# Patient Record
Sex: Female | Born: 1980 | Race: Black or African American | Hispanic: No | Marital: Married | Smoking: Never smoker
Health system: Southern US, Community
[De-identification: ages and names within clinical notes are randomized; demographics above are authoritative.]

---

## 2018-11-14 DIAGNOSIS — J0100 Acute maxillary sinusitis, unspecified: Secondary | ICD-10-CM | POA: Diagnosis not present

## 2019-05-02 DIAGNOSIS — Z01419 Encounter for gynecological examination (general) (routine) without abnormal findings: Secondary | ICD-10-CM | POA: Diagnosis not present

## 2019-05-02 DIAGNOSIS — Z118 Encounter for screening for other infectious and parasitic diseases: Secondary | ICD-10-CM | POA: Diagnosis not present

## 2019-05-02 DIAGNOSIS — Z3042 Encounter for surveillance of injectable contraceptive: Secondary | ICD-10-CM | POA: Diagnosis not present

## 2019-05-02 DIAGNOSIS — Z6831 Body mass index (BMI) 31.0-31.9, adult: Secondary | ICD-10-CM | POA: Diagnosis not present

## 2019-05-30 DIAGNOSIS — E782 Mixed hyperlipidemia: Secondary | ICD-10-CM | POA: Diagnosis not present

## 2019-05-30 DIAGNOSIS — R21 Rash and other nonspecific skin eruption: Secondary | ICD-10-CM | POA: Diagnosis not present

## 2019-05-30 DIAGNOSIS — J302 Other seasonal allergic rhinitis: Secondary | ICD-10-CM | POA: Diagnosis not present

## 2019-05-30 DIAGNOSIS — Z91048 Other nonmedicinal substance allergy status: Secondary | ICD-10-CM | POA: Diagnosis not present

## 2019-05-31 DIAGNOSIS — Z1389 Encounter for screening for other disorder: Secondary | ICD-10-CM | POA: Diagnosis not present

## 2019-05-31 DIAGNOSIS — D649 Anemia, unspecified: Secondary | ICD-10-CM | POA: Diagnosis not present

## 2019-05-31 DIAGNOSIS — E663 Overweight: Secondary | ICD-10-CM | POA: Diagnosis not present

## 2019-05-31 DIAGNOSIS — Z13220 Encounter for screening for lipoid disorders: Secondary | ICD-10-CM | POA: Diagnosis not present

## 2019-05-31 DIAGNOSIS — E782 Mixed hyperlipidemia: Secondary | ICD-10-CM | POA: Diagnosis not present

## 2019-05-31 DIAGNOSIS — Z1329 Encounter for screening for other suspected endocrine disorder: Secondary | ICD-10-CM | POA: Diagnosis not present

## 2019-06-22 ENCOUNTER — Other Ambulatory Visit: Payer: Self-pay

## 2019-06-22 ENCOUNTER — Ambulatory Visit (HOSPITAL_COMMUNITY)
Admission: EM | Admit: 2019-06-22 | Discharge: 2019-06-22 | Disposition: A | Discharge status: Home / Self Care | Payer: BC Managed Care – PPO

## 2019-06-22 ENCOUNTER — Encounter (HOSPITAL_COMMUNITY): Payer: Self-pay

## 2019-06-22 DIAGNOSIS — S0502XA Injury of conjunctiva and corneal abrasion without foreign body, left eye, initial encounter: Secondary | ICD-10-CM | POA: Diagnosis not present

## 2019-06-22 MED ORDER — TETRACAINE HCL 0.5 % OP SOLN
OPHTHALMIC | Status: AC
  Filled 2019-06-22: qty 4

## 2019-06-22 MED ORDER — ERYTHROMYCIN 5 MG/GM OP OINT: TOPICAL_OINTMENT | OPHTHALMIC | 0 refills | Status: DC

## 2019-06-22 MED ORDER — FLUORESCEIN SODIUM 1 MG OP STRP
ORAL_STRIP | OPHTHALMIC | Status: AC
  Filled 2019-06-22: qty 1

## 2019-06-22 NOTE — ED Triage Notes (Signed)
Pt states her left eye started being irritated yesterday and was having some watery eyes. Did take her contact out and the pain got worse and was unable to get it back in. Flushed her eye but then this morning having swelling, pain, redness, discharge and sensitivity to light. Felt as if something is in her eye.

## 2019-06-22 NOTE — ED Provider Notes (Signed)
[facility]    CSN: [id number] Arrival date & time: 06/22/19  1027     History   Chief Complaint Chief Complaint  Patient presents with  . Eye Problem    HPI Jessica Mejia is a 38 y.o. female.   Patient is a 38 year old female who presents today with left eye discomfort, irritation, redness and watering.  This began yesterday.  Patient does wear contacts.  She took her contacts out yesterday and flushed the eye out without much relief.  This morning started having increased swelling, pain, redness and discharge.  Some photophobia.  Has sensation of something in the eye.  Denies any known foreign bodies in the eye.  Denies any injuries to the eye.   ROS per HPI      History reviewed. No pertinent past medical history.  There are no active problems to display for this patient.   History reviewed. No pertinent surgical history.  OB History   No obstetric history on file.      Home Medications    Prior to Admission medications   Medication Sig Start Date End Date Taking? Authorizing Provider  medroxyPROGESTERone (DEPO-PROVERA) 150 MG/ML injection INJECT 1ML EVERY 11 12 WEEKS 05/01/19  Yes [provider]  phentermine (ADIPEX-P) 37.5 MG tablet Take 37.5 mg by mouth daily. 05/19/19  Yes [provider]  erythromycin ophthalmic ointment Place a 1/2 inch ribbon of ointment into the lower eyelid 4 x day 06/22/19   Orvan July, NP    Family History Family History  Problem Relation Age of Onset  . Diabetes Mother   . Hypertension Mother   . Heart disease Father     Social History Social History   Tobacco Use  . Smoking status: Never Smoker  . Smokeless tobacco: Never Used  Substance Use Topics  . Alcohol use: Yes  . Drug use: Never     Allergies   Diphenhydramine hcl (sleep) and Nickel   Review of Systems Review of Systems   Physical Exam Triage Vital Signs ED Triage Vitals  Enc Vitals Group     BP 06/22/19 0928 (!)  120/95     Pulse Rate 06/22/19 0928 93     Resp 06/22/19 0928 18     Temp 06/22/19 0928 98.2 F (36.8 C)     Temp Source 06/22/19 0928 Oral     SpO2 06/22/19 0928 99 %     Weight 06/22/19 0930 178 lb (80.7 kg)     Height 06/22/19 0930 5\' 4"  (1.626 m)     Head Circumference --      Peak Flow --      Pain Score 06/22/19 0929 6     Pain Loc --      Pain Edu? --      Excl. in [place]? --    No data found.  Updated Vital Signs BP (!) 120/95 (BP Location: Left Arm)   Pulse 93   Temp 98.2 F (36.8 C) (Oral)   Resp 18   Ht 5\' 4"  (1.626 m)   Wt 178 lb (80.7 kg)   SpO2 99%   BMI 30.55 kg/m   Visual Acuity Right Eye Distance: 20/15 (corrected) Left Eye Distance: Unable to see any letters without her contacts Bilateral Distance:    Right Eye Near:   Left Eye Near:    Bilateral Near:     Physical Exam Vitals signs and nursing note reviewed.  Constitutional:      General: She is  not in acute distress.    Appearance: Normal appearance. She is not ill-appearing, toxic-appearing or diaphoretic.  HENT:     Head: Normocephalic and atraumatic.     Nose: Nose normal.     Mouth/Throat:     Pharynx: Oropharynx is clear.  Eyes:     General: Lids are normal.        Left eye: Discharge present.    Extraocular Movements: Extraocular movements intact.     Conjunctiva/sclera:     Right eye: Right conjunctiva is not injected. No chemosis, exudate or hemorrhage.    Left eye: Left conjunctiva is injected. No chemosis, exudate or hemorrhage.    Pupils: Pupils are equal, round, and reactive to light.      Comments: Corneal abrasion noted to left cornea at 6 o'clock.   Neurological:     Mental Status: She is alert.      UC Treatments / Results  Labs (all labs ordered are listed, but only abnormal results are displayed) Labs Reviewed - No data to display  EKG   Radiology No results found.  Procedures Procedures (including critical care time)  Medications Ordered in UC  Medications  tetracaine (PONTOCAINE) 0.5 % ophthalmic solution (has no administration in time range)  fluorescein 1 MG ophthalmic strip (has no administration in time range)    Initial Impression / Assessment and Plan / UC Course  I have reviewed the triage vital signs and the nursing notes.  Pertinent labs & imaging results that were available during my care of the patient were reviewed by me and considered in my medical decision making (see chart for details).     Corneal abrasion- treating with erythromycin ointment, 4 to 6 times a day. Follow up with ophthalmology as needed.   Final Clinical Impressions(s) / UC Diagnoses   Final diagnoses:  Abrasion of left cornea, initial encounter     Discharge Instructions     Small abrasion to left cornea most likely due to contacts or foreign body  Treating this with erythromycin ointment. Use this 4-6 times a day.  If the problem persist she will need to follow-up with your ophthalmologist.    ED Prescriptions    Medication Sig Dispense Auth. Provider   erythromycin ophthalmic ointment  (Status: Discontinued) Place a 1/2 inch ribbon of ointment into the lower eyelid 4 x day 3.5 g Donise Woodle A, NP   erythromycin ophthalmic ointment  (Status: Discontinued) Place a 1/2 inch ribbon of ointment into the lower eyelid 4 x day 3.5 g Stevana Dufner A, NP   erythromycin ophthalmic ointment  (Status: Discontinued) Place a 1/2 inch ribbon of ointment into the lower eyelid 4 x day 3.5 g Rider Ermis A, NP   erythromycin ophthalmic ointment Place a 1/2 inch ribbon of ointment into the lower eyelid 4 x day 3.5 g Jaci LazierBast, Natausha Jungwirth A, NP     Controlled Substance Prescriptions [place] Controlled Substance Registry consulted? Not Applicable   Janace ArisBast, Briant Angelillo A, NP 06/22/19 1045

## 2019-06-22 NOTE — Discharge Instructions (Addendum)
Small abrasion to left cornea most likely due to contacts or foreign body  Treating this with erythromycin ointment. Use this 4-6 times a day.  If the problem persist she will need to follow-up with your ophthalmologist.

## 2019-07-25 DIAGNOSIS — Z3042 Encounter for surveillance of injectable contraceptive: Secondary | ICD-10-CM | POA: Diagnosis not present

## 2019-09-16 ENCOUNTER — Other Ambulatory Visit: Payer: Self-pay

## 2019-09-16 DIAGNOSIS — Z20822 Contact with and (suspected) exposure to covid-19: Secondary | ICD-10-CM

## 2019-09-17 LAB — NOVEL CORONAVIRUS, NAA: SARS-CoV-2, NAA: NOT DETECTED

## 2021-02-12 ENCOUNTER — Ambulatory Visit (HOSPITAL_COMMUNITY)
Admission: RE | Admit: 2021-02-12 | Discharge: 2021-02-12 | Disposition: A | Discharge status: Home / Self Care | Payer: BC Managed Care – PPO | Source: Ambulatory Visit

## 2021-02-12 ENCOUNTER — Encounter (HOSPITAL_COMMUNITY): Payer: Self-pay

## 2021-02-12 ENCOUNTER — Ambulatory Visit (INDEPENDENT_AMBULATORY_CARE_PROVIDER_SITE_OTHER): Payer: BC Managed Care – PPO

## 2021-02-12 ENCOUNTER — Other Ambulatory Visit: Payer: Self-pay

## 2021-02-12 VITALS — BP 135/89 | HR 100 | Temp 99.0°F | Resp 20

## 2021-02-12 DIAGNOSIS — M25572 Pain in left ankle and joints of left foot: Secondary | ICD-10-CM | POA: Diagnosis not present

## 2021-02-12 DIAGNOSIS — W19XXXA Unspecified fall, initial encounter: Secondary | ICD-10-CM | POA: Diagnosis not present

## 2021-02-12 DIAGNOSIS — S9001XA Contusion of right ankle, initial encounter: Secondary | ICD-10-CM | POA: Diagnosis not present

## 2021-02-12 DIAGNOSIS — M25472 Effusion, left ankle: Secondary | ICD-10-CM | POA: Diagnosis not present

## 2021-02-12 MED ORDER — IBUPROFEN 800 MG PO TABS: 800.0000 mg | ORAL_TABLET | Freq: Three times a day (TID) | ORAL | 0 refills | Status: DC

## 2021-02-12 MED ORDER — CYCLOBENZAPRINE HCL 10 MG PO TABS: 10.0000 mg | ORAL_TABLET | Freq: Every day | ORAL | 0 refills | Status: DC

## 2021-02-12 NOTE — Discharge Instructions (Addendum)
Take ibuprofen 800 mg with food three times a day for 3 days then as needed  Can use brace or ace wrap for additional support while moving around  Can use heat or ice per preference for additional comfort  Follow up for increased swelling , increased pain, decreased movement of ankle

## 2021-02-12 NOTE — ED Triage Notes (Signed)
Patient fell in [place] on Friday.  Patient was walking quickly, looking forward.  Stepped on uneven curbing, rolled left ankle and landed on right knee.  Left ankle is swollen and painful.  Patient states lateral ankle is painful to palpation, to weight bearing medial is painful.  Visible swelling and bruising present.

## 2021-02-12 NOTE — ED Provider Notes (Signed)
[facility]    CSN: [id number] Arrival date & time: 02/12/21  1352      History   Chief Complaint Chief Complaint  Patient presents with  . Appointment    14:00  . Ankle Pain    HPI Jessica Mejia is a 40 y.o. female.   Patient presents with left ankle pain and swelling from rolling ankle both directions after stumbling off curb. Able to bear weight but painful. ROM intact but painful. Bruising along medial foot but resolved. Denies numbness, tingling.   History reviewed. No pertinent past medical history.  There are no problems to display for this patient.   History reviewed. No pertinent surgical history.  OB History   No obstetric history on file.      Home Medications    Prior to Admission medications   Medication Sig Start Date End Date Taking? Authorizing Provider  folic acid (FOLVITE) 1 MG tablet Take 1 mg by mouth daily.   Yes [provider]  NON FORMULARY Allergy medicine, otc   Yes [provider]  erythromycin ophthalmic ointment Place a 1/2 inch ribbon of ointment into the lower eyelid 4 x day 06/22/19   Dahlia Byes A, NP  medroxyPROGESTERone (DEPO-PROVERA) 150 MG/ML injection INJECT EVERY 11 12 WEEKS 05/01/19   [provider]  phentermine (ADIPEX-P) 37.5 MG tablet Take 37.5 mg by mouth daily. 05/19/19   [provider]    Family History Family History  Problem Relation Age of Onset  . Diabetes Mother   . Hypertension Mother   . Heart disease Father     Social History Social History   Tobacco Use  . Smoking status: Never Smoker  . Smokeless tobacco: Never Used  Vaping Use  . Vaping Use: Never used  Substance Use Topics  . Alcohol use: Yes  . Drug use: Never     Allergies   Diphenhydramine hcl (sleep) and Nickel   Review of Systems Review of Systems  Constitutional: Negative.   Respiratory: Negative.   Cardiovascular: Negative.   Musculoskeletal: Negative.   Neurological:  Negative.      Physical Exam Triage Vital Signs ED Triage Vitals  Enc Vitals Group     BP 02/12/21 1429 135/89     Pulse Rate 02/12/21 1429 100     Resp 02/12/21 1429 20     Temp 02/12/21 1429 99 F (37.2 C)     Temp Source 02/12/21 1429 Oral     SpO2 02/12/21 1429 97 %     Weight --      Height --      Head Circumference --      Peak Flow --      Pain Score 02/12/21 1424 6     Pain Loc --      Pain Edu? --      Excl. in [place]? --    No data found.  Updated Vital Signs BP 135/89 (BP Location: Right Arm)   Pulse 100   Temp 99 F (37.2 C) (Oral)   Resp 20   SpO2 97%   Visual Acuity Right Eye Distance:   Left Eye Distance:   Bilateral Distance:    Right Eye Near:   Left Eye Near:    Bilateral Near:     Physical Exam Constitutional:      Appearance: Normal appearance. She is normal weight.  HENT:     Head: Normocephalic.  Eyes:     Extraocular Movements: Extraocular movements  intact.  Pulmonary:     Effort: Pulmonary effort is normal.  Musculoskeletal:     Cervical back: Normal range of motion.     Right ankle: Normal.     Left ankle: Swelling present. No ecchymosis. Tenderness present over the CF ligament. Normal range of motion.     Left Achilles Tendon: Normal.  Skin:    General: Skin is warm and dry.  Neurological:     General: No focal deficit present.     Mental Status: She is alert and oriented to person, place, and time. Mental status is at baseline.  Psychiatric:        Mood and Affect: Mood normal.        Behavior: Behavior normal.        Thought Content: Thought content normal.        Judgment: Judgment normal.      UC Treatments / Results  Labs (all labs ordered are listed, but only abnormal results are displayed) Labs Reviewed - No data to display  EKG   Radiology No results found.  Procedures Procedures (including critical care time)  Medications Ordered in UC Medications - No data to display  Initial Impression /  Assessment and Plan / UC Course  I have reviewed the triage vital signs and the nursing notes.  Pertinent labs & imaging results that were available during my care of the patient were reviewed by me and considered in my medical decision making (see chart for details).  Acute left ankle pain   1. Xray left ankle- soft tissue swelling 2. Ibuprofen 800 mg tid 3. Flexeril 10 mg bedtime prn 4. Heat or ice 15 minute intervals 5. May continue use of ankle brace for support  Final Clinical Impressions(s) / UC Diagnoses   Final diagnoses:  None   Discharge Instructions   None    ED Prescriptions    None     PDMP not reviewed this encounter.   Valinda Hoar, [place] 02/12/21 [phone]

## 2022-08-17 ENCOUNTER — Telehealth: Payer: BC Managed Care – PPO | Admitting: Family Medicine

## 2022-08-17 DIAGNOSIS — B9789 Other viral agents as the cause of diseases classified elsewhere: Secondary | ICD-10-CM

## 2022-08-17 DIAGNOSIS — J019 Acute sinusitis, unspecified: Secondary | ICD-10-CM | POA: Diagnosis not present

## 2022-08-17 MED ORDER — FLUTICASONE PROPIONATE 50 MCG/ACT NA SUSP: 2.0000 | Freq: Every day | NASAL | 0 refills | Status: DC

## 2022-08-17 NOTE — Progress Notes (Signed)
Virtual Visit Consent   Jessica Mejia, you are scheduled for a virtual visit with a [facility] provider today. Just as with appointments in the office, your consent must be obtained to participate. Your consent will be active for this visit and any virtual visit you may have with one of our providers in the next 365 days. If you have a MyChart account, a copy of this consent can be sent to you electronically.  As this is a virtual visit, video technology does not allow for your provider to perform a traditional examination. This may limit your provider's ability to fully assess your condition. If your provider identifies any concerns that need to be evaluated in person or the need to arrange testing (such as labs, EKG, etc.), we will make arrangements to do so. Although advances in technology are sophisticated, we cannot ensure that it will always work on either your end or our end. If the connection with a video visit is poor, the visit may have to be switched to a telephone visit. With either a video or telephone visit, we are not always able to ensure that we have a secure connection.  By engaging in this virtual visit, you consent to the provision of healthcare and authorize for your insurance to be billed (if applicable) for the services provided during this visit. Depending on your insurance coverage, you may receive a charge related to this service.  I need to obtain your verbal consent now. Are you willing to proceed with your visit today? Jessica Mejia has provided verbal consent on 08/17/2022 for a virtual visit (video or telephone). Perlie Mayo, NP  Date: 08/17/2022 10:32 AM  Virtual Visit via Video Note   I, Perlie Mayo, connected with  Jessica Mejia  ([id number], 05-Aug-1981) on 08/17/22 at 10:30 AM EDT by a video-enabled telemedicine application and verified that I am speaking with the correct person using two identifiers.  Location: Patient: Virtual Visit  Location Patient: Home Provider: Virtual Visit Location Provider: Home Office   I discussed the limitations of evaluation and management by telemedicine and the availability of in person appointments. The patient expressed understanding and agreed to proceed.    History of Present Illness: Jessica Mejia is a 41 y.o. who identifies as a female who was assigned female at birth, and is being seen today for sore throat and congestion. Started Saturday with sore throat and tickle, cold and flu OTC started, then progressed to have congestion, headache (which has improved), dry cough mostly. Denies ear pain, chest pain, shortness of breath.Known sick contact in last week with family member. COVID negative on HT  Problems: There are no problems to display for this patient.   Allergies:  Allergies  Allergen Reactions   Diphenhydramine Hcl (Sleep) Swelling   Nickel Rash   Medications:  Current Outpatient Medications:    cyclobenzaprine (FLEXERIL) 10 MG tablet, Take 1 tablet (10 mg total) by mouth at bedtime., Disp: 10 tablet, Rfl: 0   folic acid (FOLVITE) 1 MG tablet, Take 1 mg by mouth daily., Disp: , Rfl:    ibuprofen (ADVIL) 800 MG tablet, Take 1 tablet (800 mg total) by mouth 3 (three) times daily., Disp: 21 tablet, Rfl: 0   NON FORMULARY, Allergy medicine, otc, Disp: , Rfl:   Observations/Objective: Patient is well-developed, well-nourished in no acute distress.  Resting comfortably  at home.  Head is normocephalic, atraumatic.  No labored breathing.  Speech is clear and coherent with logical  content.  Patient is alert and oriented at baseline.    Assessment and Plan: 1. Acute viral sinusitis  - fluticasone (FLONASE) 50 MCG/ACT nasal spray; Place 2 sprays into both nostrils daily.  Dispense: 16 g; Refill: 0  -rest -hydrate -OTC reviewed, pt TTC -Flonase for now, advised to stop once + HPT and call OB   Reviewed side effects, risks and benefits of medication.    Patient  acknowledged agreement and understanding of the plan.   Past Medical, Surgical, Social History, Allergies, and Medications have been Reviewed.    Follow Up Instructions: I discussed the assessment and treatment plan with the patient. The patient was provided an opportunity to ask questions and all were answered. The patient agreed with the plan and demonstrated an understanding of the instructions.  A copy of instructions were sent to the patient via MyChart unless otherwise noted below.     The patient was advised to call back or seek an in-person evaluation if the symptoms worsen or if the condition fails to improve as anticipated.  Time:  I spent 10 minutes with the patient via telehealth technology discussing the above problems/concerns.    Freddy Finner, NP

## 2022-08-17 NOTE — Patient Instructions (Addendum)
  [name], thank you for joining Perlie Mayo, NP for today's virtual visit.  While this provider is not your primary care provider (PCP), if your PCP is located in our provider database this encounter information will be shared with them immediately following your visit.  Consent: (Patient) Jessica Mejia provided verbal consent for this virtual visit at the beginning of the encounter.  Current Medications:  Current Outpatient Medications:    fluticasone (FLONASE) 50 MCG/ACT nasal spray, Place 2 sprays into both nostrils daily., Disp: 16 g, Rfl: 0   cyclobenzaprine (FLEXERIL) 10 MG tablet, Take 1 tablet (10 mg total) by mouth at bedtime., Disp: 10 tablet, Rfl: 0   folic acid (FOLVITE) 1 MG tablet, Take 1 mg by mouth daily., Disp: , Rfl:    ibuprofen (ADVIL) 800 MG tablet, Take 1 tablet (800 mg total) by mouth 3 (three) times daily., Disp: 21 tablet, Rfl: 0   NON FORMULARY, Allergy medicine, otc, Disp: , Rfl:    Medications ordered in this encounter:  Meds ordered this encounter  Medications   fluticasone (FLONASE) 50 MCG/ACT nasal spray    Sig: Place 2 sprays into both nostrils daily.    Dispense:  16 g    Refill:  0    Order Specific Question:   Supervising Provider    Answer:   Chase Picket [id number]     *If you need refills on other medications prior to your next appointment, please contact your pharmacy*  Follow-Up: Call back or seek an in-person evaluation if the symptoms worsen or if the condition fails to improve as anticipated.  [place] [phone]  Other Instructions  Please use Delsym for cough Flonase safe- but stop if your pregnancy test turns up positive  Hydrate and rest   If you have been instructed to have an in-person evaluation today at a local Urgent Care facility, please use the link below. It will take you to a list of all of our available [facility], including address, phone number and hours of  operation. Please do not delay care.  [facility]  If you or a family member do not have a primary care provider, use the link below to schedule a visit and establish care. When you choose a [facility] primary care physician or advanced practice provider, you gain a long-term partner in health. Find a Primary Care Provider  Learn more about [facility]'s in-office and virtual care options: [facility]

## 2022-09-16 IMAGING — DX DG ANKLE COMPLETE 3+V*L*
3 series · 3 of 3 positions shown · non-contrast
Comparison: None.

CLINICAL DATA: Status post fall. Rolled ankle. Pain and swelling
with bruising.

EXAM:
LEFT ANKLE COMPLETE - 3+ VIEW

[ankle ap]
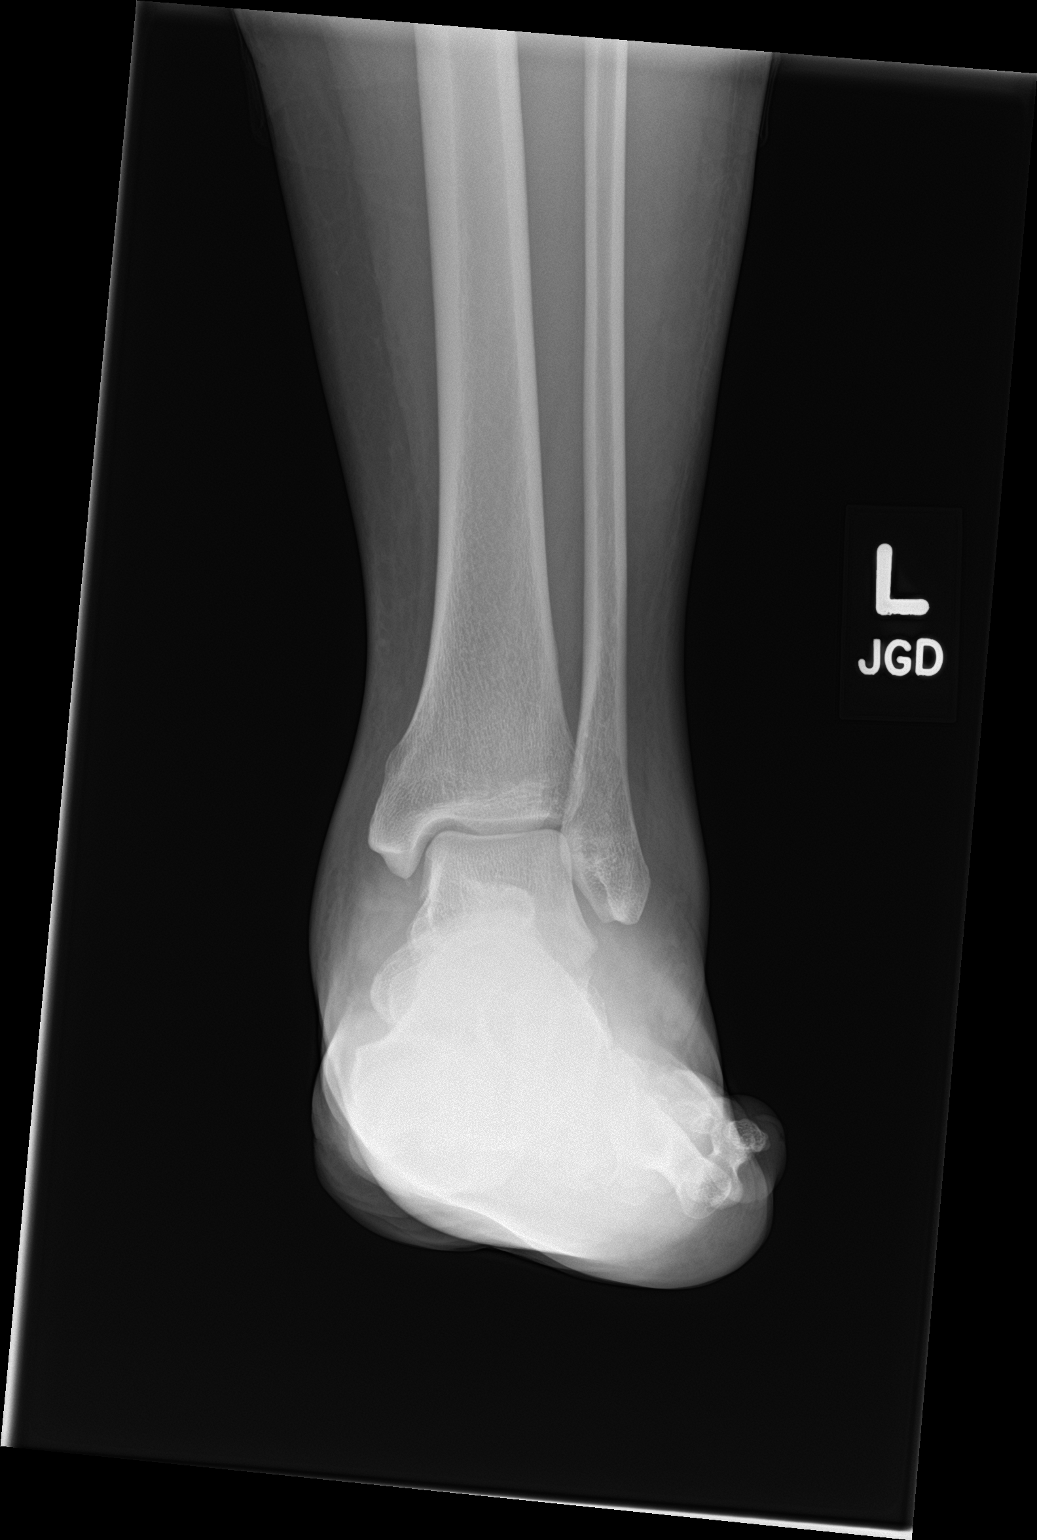

[ankle obl]
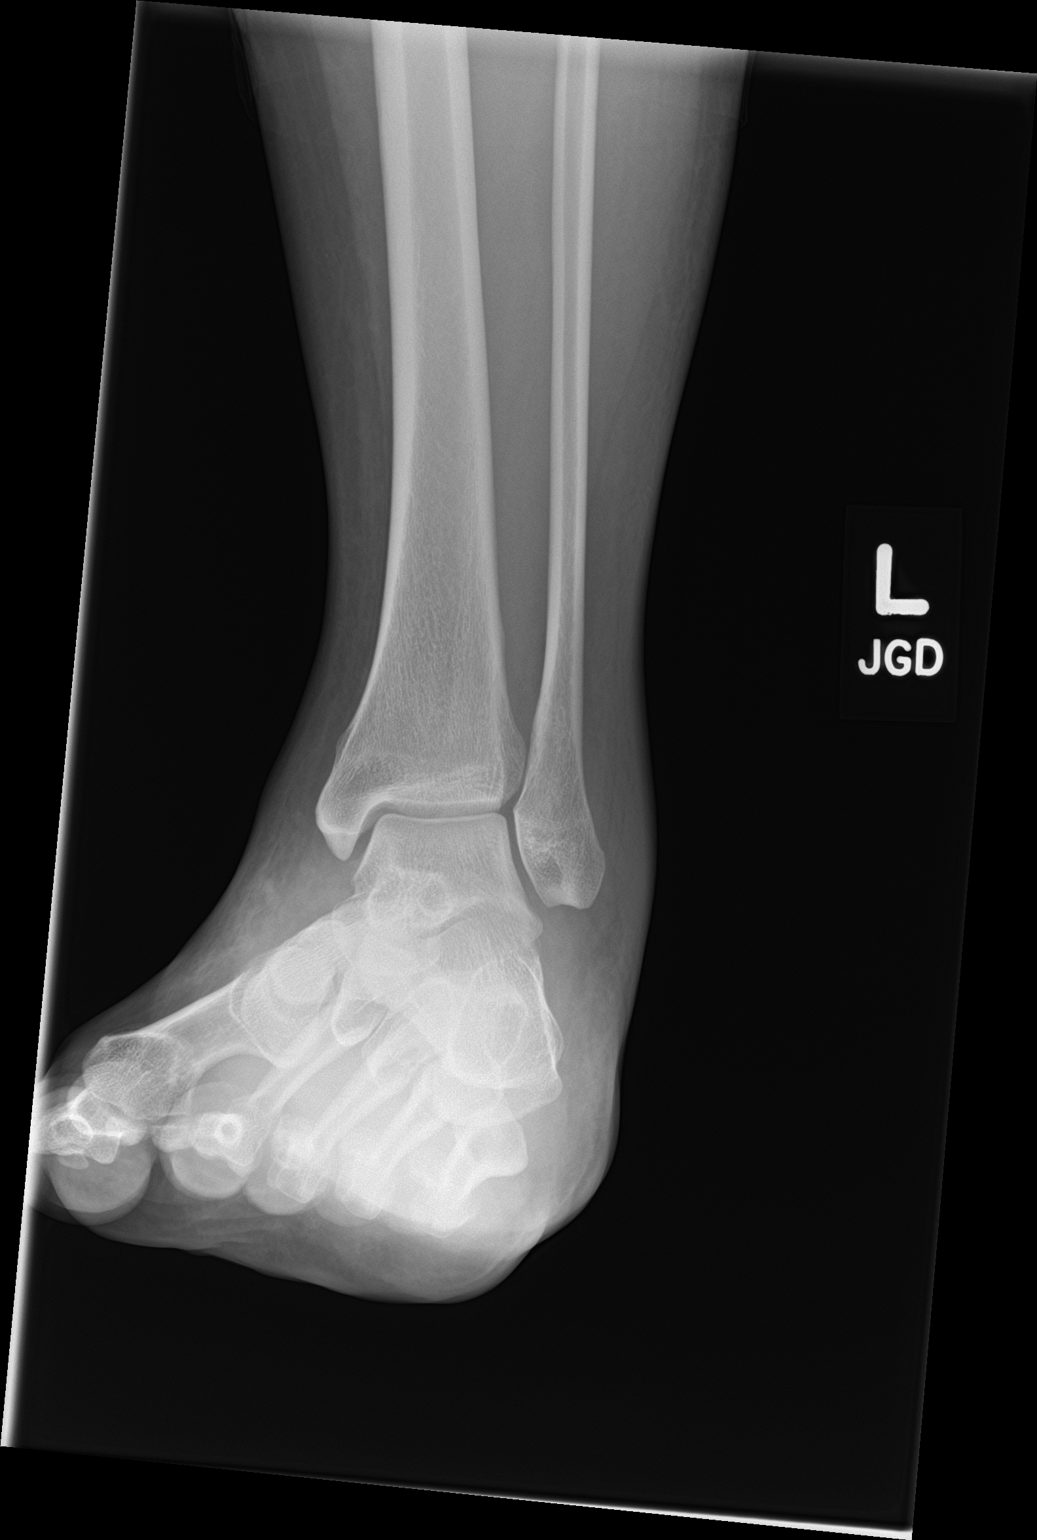

[ankle lat]
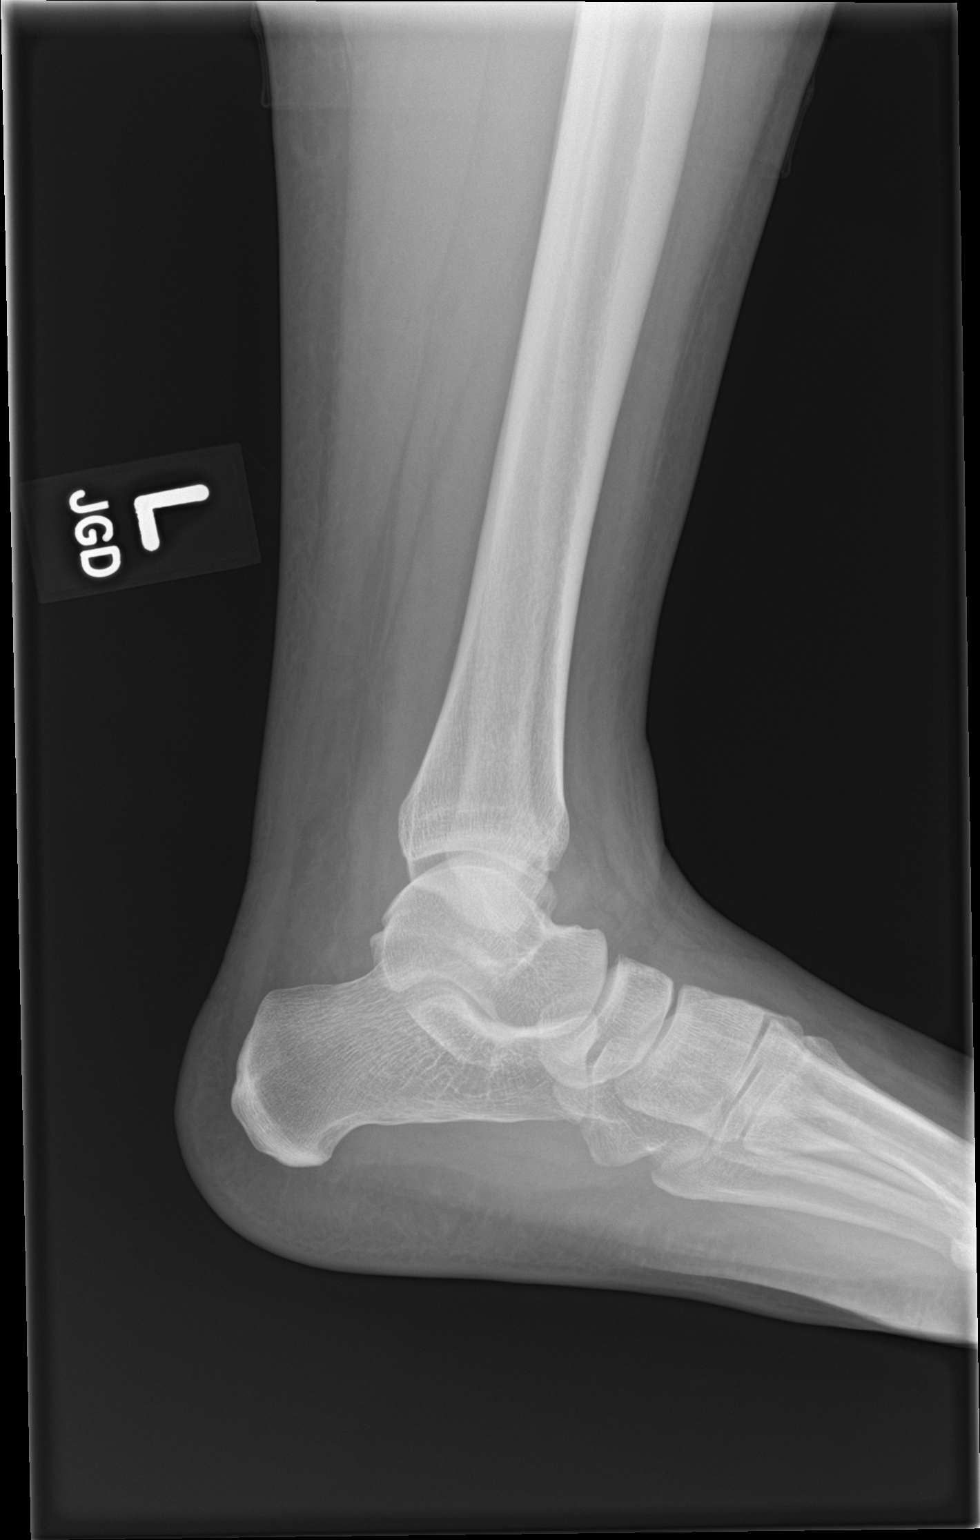

[3 of 3 positions shown; findings below may reference images not displayed]

FINDINGS: There is mild diffuse soft tissue swelling. No acute fracture or
dislocation identified. No radiopaque foreign bodies.
IMPRESSION: 1. Soft tissue swelling.
2. No acute bone abnormality.

## 2022-10-30 ENCOUNTER — Ambulatory Visit (HOSPITAL_COMMUNITY)
Admission: EM | Admit: 2022-10-30 | Discharge: 2022-10-30 | Discharge status: Against Medical Advice | Payer: BC Managed Care – PPO

## 2022-11-09 ENCOUNTER — Ambulatory Visit: Admit: 2022-11-09 | Discharge status: Against Medical Advice | Payer: BC Managed Care – PPO

## 2022-11-09 ENCOUNTER — Ambulatory Visit
Admission: EM | Admit: 2022-11-09 | Discharge: 2022-11-09 | Disposition: A | Discharge status: Home / Self Care | Payer: 59 | Attending: Family Medicine | Admitting: Family Medicine

## 2022-11-09 DIAGNOSIS — N3091 Cystitis, unspecified with hematuria: Secondary | ICD-10-CM | POA: Insufficient documentation

## 2022-11-09 DIAGNOSIS — N760 Acute vaginitis: Secondary | ICD-10-CM

## 2022-11-09 DIAGNOSIS — R3129 Other microscopic hematuria: Secondary | ICD-10-CM

## 2022-11-09 DIAGNOSIS — B3731 Acute candidiasis of vulva and vagina: Secondary | ICD-10-CM | POA: Insufficient documentation

## 2022-11-09 LAB — POCT URINALYSIS DIP (MANUAL ENTRY)
Bilirubin, UA: NEGATIVE
Glucose, UA: NEGATIVE mg/dL
Leukocytes, UA: NEGATIVE
Nitrite, UA: NEGATIVE
Protein Ur, POC: NEGATIVE mg/dL
Spec Grav, UA: >=1.03 — AB (ref 1.010–1.025)
Urobilinogen, UA: 0.2 E.U./dL
pH, UA: 5.5 (ref 5.0–8.0)

## 2022-11-09 NOTE — ED Triage Notes (Signed)
Pt c/o urinary frequency and dysuria since 12/23. At home test pos for UTI. Did goodrx televisit. Tx with Bactrim and also diflucan for yeast infection. Took the diflucan 12/28. Finished abx and sxs improved. Still thinks has yeast infection sxs. Neg UTI at home on Sunday.

## 2022-11-09 NOTE — ED Provider Notes (Signed)
[facility]    CSN: [id number] Arrival date & time: 11/09/22  1909      History   Chief Complaint Chief Complaint  Patient presents with   Dysuria    HPI Jessica Mejia is a 42 y.o. female.   HPI  Patient had a televisit for cystitis and was given Bactrim.  She completed the Bactrim and then developed a yeast infection.  She was called in [place].  She is here because she still has some vaginal irritation unidentified. She does not think she has an STD. No longer has dysuria  History reviewed. No pertinent past medical history.  There are no problems to display for this patient.   History reviewed. No pertinent surgical history.  OB History   No obstetric history on file.      Home Medications    Prior to Admission medications   Medication Sig Start Date End Date Taking? Authorizing Provider  cyclobenzaprine (FLEXERIL) 10 MG tablet Take 1 tablet (10 mg total) by mouth at bedtime. 02/12/21   White, Leitha Schuller, NP  fluticasone (FLONASE) 50 MCG/ACT nasal spray Place 2 sprays into both nostrils daily. 08/17/22   Perlie Mayo, NP  folic acid (FOLVITE) 1 MG tablet Take 1 mg by mouth daily.    [provider]  ibuprofen (ADVIL) 800 MG tablet Take 1 tablet (800 mg total) by mouth 3 (three) times daily. 02/12/21   White, Leitha Schuller, NP  NON FORMULARY Allergy medicine, otc    [provider]  medroxyPROGESTERone (DEPO-PROVERA) 150 MG/ML injection INJECT 1ML EVERY 11 12 WEEKS 05/01/19 02/12/21  [provider]  phentermine (ADIPEX-P) 37.5 MG tablet Take 37.5 mg by mouth daily. 05/19/19 02/12/21  [provider]    Family History Family History  Problem Relation Age of Onset   Diabetes Mother    Hypertension Mother    Heart disease Father     Social History Social History   Tobacco Use   Smoking status: Never   Smokeless tobacco: Never  Vaping Use   Vaping Use: Never used  Substance Use Topics   Alcohol use: Yes    Drug use: Never     Allergies   Diphenhydramine hcl (sleep) and Nickel   Review of Systems Review of Systems See HPI  Physical Exam Triage Vital Signs ED Triage Vitals  Enc Vitals Group     BP 11/09/22 1938 138/87     Pulse Rate 11/09/22 1938 89     Resp 11/09/22 1938 17     Temp 11/09/22 1938 98.3 F (36.8 C)     Temp Source 11/09/22 1938 Oral     SpO2 11/09/22 1938 98 %     Weight --      Height --      Head Circumference --      Peak Flow --      Pain Score 11/09/22 1939 0     Pain Loc --      Pain Edu? --      Excl. in [place]? --    No data found.  Updated Vital Signs BP 138/87 (BP Location: Right Arm)   Pulse 89   Temp 98.3 F (36.8 C) (Oral)   Resp 17   SpO2 98%   Physical Exam Constitutional:      General: She is not in acute distress.    Appearance: Normal appearance. She is well-developed. She is not ill-appearing.  HENT:     Head: Normocephalic and atraumatic.  Eyes:     Conjunctiva/sclera: Conjunctivae normal.     Pupils: Pupils are equal, round, and reactive to light.  Cardiovascular:     Rate and Rhythm: Normal rate.  Pulmonary:     Effort: Pulmonary effort is normal. No respiratory distress.  Abdominal:     General: There is no distension.     Palpations: Abdomen is soft.     Tenderness: There is no right CVA tenderness or left CVA tenderness.  Musculoskeletal:        General: Normal range of motion.     Cervical back: Normal range of motion.  Skin:    General: Skin is warm and dry.  Neurological:     Mental Status: She is alert.      UC Treatments / Results  Labs (all labs ordered are listed, but only abnormal results are displayed) Labs Reviewed  POCT URINALYSIS DIP (MANUAL ENTRY) - Abnormal; Notable for the following components:      Result Value   Ketones, POC UA small (15) (*)    Spec Grav, UA >=1.030 (*)    Blood, UA small (*)    All other components within normal limits  URINE CULTURE  CERVICOVAGINAL ANCILLARY ONLY     EKG   Radiology No results found.  Procedures Procedures (including critical care time)  Medications Ordered in UC Medications - No data to display  Initial Impression / Assessment and Plan / UC Course  I have reviewed the triage vital signs and the nursing notes.  Pertinent labs & imaging results that were available during my care of the patient were reviewed by me and considered in my medical decision making (see chart for details).     Because of microscopic hematuria I am sending the urine for culture.  Also going to do a vaginal swab to check on her complaint of vaginitis.  She will be called with any positive test results.  We will treat appropriately Final Clinical Impressions(s) / UC Diagnoses   Final diagnoses:  Acute vaginitis  Microscopic hematuria     Discharge Instructions      Continue to drink water Check MyChart for test results You will be called if any results are positive Call Wednesday after noon before you leave to check on your urine culture   ED Prescriptions   None    PDMP not reviewed this encounter.   Raylene Everts, MD 11/09/22 2032

## 2022-11-09 NOTE — Discharge Instructions (Addendum)
Continue to drink water Check MyChart for test results You will be called if any results are positive Call Wednesday after noon before you leave to check on your urine culture

## 2022-11-10 ENCOUNTER — Telehealth: Payer: Self-pay

## 2022-11-10 ENCOUNTER — Telehealth: Payer: Self-pay | Admitting: Family Medicine

## 2022-11-10 MED ORDER — SULFAMETHOXAZOLE-TRIMETHOPRIM 800-160 MG PO TABS: 1.0000 | ORAL_TABLET | Freq: Two times a day (BID) | ORAL | 0 refills | Status: AC

## 2022-11-10 NOTE — Telephone Encounter (Signed)
Pt calls [facility] to report that she is going out of the country tomorrow morning and her urine culture results are still in process. She was in for UTI s/s yesterday and UA showed a small amount of blood. Pt states she will likely be out of the country when results are finalized and she is concerned about not having an antibiotic to treat her if she has a UTI. Discussed w/ M. Ragan, FNP, who states he will submit Rx to [facility] on [address] in [place]. Notified pt and advised to check for urine culture results tomorrow if possible and begin antibiotic only if the urine culture indicates this is necessary. She verbalizes understanding.

## 2022-11-10 NOTE — Telephone Encounter (Signed)
Patient request antibiotic for possible UTI as she is going out of the country tomorrow and urine culture is not back.  Patient advised we will send Bactrim to her pharmacy now.

## 2022-11-11 LAB — CERVICOVAGINAL ANCILLARY ONLY
Bacterial Vaginitis (gardnerella): NEGATIVE
Candida Glabrata: NEGATIVE
Candida Vaginitis: NEGATIVE
Chlamydia: NEGATIVE
Comment: NEGATIVE
Comment: NEGATIVE
Comment: NEGATIVE
Comment: NEGATIVE
Comment: NEGATIVE
Comment: NORMAL
Neisseria Gonorrhea: NEGATIVE
Trichomonas: NEGATIVE

## 2022-11-11 LAB — URINE CULTURE: Culture: <10000 — AB

## 2024-10-13 ENCOUNTER — Ambulatory Visit
Admission: RE | Admit: 2024-10-13 | Discharge: 2024-10-13 | Disposition: A | Discharge status: Home / Self Care | Source: Ambulatory Visit | Attending: Nurse Practitioner

## 2024-10-13 VITALS — BP 126/86 | HR 83 | Temp 98.2°F | Resp 17

## 2024-10-13 DIAGNOSIS — M545 Low back pain, unspecified: Secondary | ICD-10-CM

## 2024-10-13 LAB — POCT URINE DIPSTICK
Bilirubin, UA: NEGATIVE
Glucose, UA: NEGATIVE mg/dL
Ketones, POC UA: NEGATIVE mg/dL
Leukocytes, UA: NEGATIVE
Nitrite, UA: NEGATIVE
POC PROTEIN,UA: 30 — AB
Spec Grav, UA: >=1.03 — AB (ref 1.010–1.025)
Urobilinogen, UA: 0.2 U/dL
pH, UA: 5.5 (ref 5.0–8.0)

## 2024-10-13 LAB — POCT URINE PREGNANCY: Preg Test, Ur: NEGATIVE

## 2024-10-13 MED ORDER — DEXAMETHASONE SOD PHOSPHATE PF 10 MG/ML IJ SOLN
10.0000 mg | Freq: Once | INTRAMUSCULAR | Status: AC
  Administered 2024-10-13: 10 mg via INTRAMUSCULAR

## 2024-10-13 MED ORDER — METHOCARBAMOL 500 MG PO TABS: 500.0000 mg | ORAL_TABLET | Freq: Every morning | ORAL | 0 refills | Status: DC

## 2024-10-13 MED ORDER — LIDOCAINE 5 % EX PTCH: 1.0000 | MEDICATED_PATCH | CUTANEOUS | 0 refills | Status: DC

## 2024-10-13 MED ORDER — CYCLOBENZAPRINE HCL 10 MG PO TABS: 10.0000 mg | ORAL_TABLET | Freq: Every day | ORAL | 0 refills | Status: DC

## 2024-10-13 MED ORDER — NAPROXEN 500 MG PO TABS: 500.0000 mg | ORAL_TABLET | Freq: Two times a day (BID) | ORAL | 0 refills | Status: DC

## 2024-10-13 NOTE — ED Provider Notes (Signed)
 [facility]    CSN: [id number] Arrival date & time: 10/13/24  9146      History   Chief Complaint Chief Complaint  Patient presents with   Back Pain    Throbbing pain in right lower back and side. Seems to get worse at night and very painful when I lay down. - Entered by patient    HPI Mechel Sherrian Nunnelley is a 43 y.o. female.   Discussed the use of AI scribe software for clinical note transcription with the patient, who gave verbal consent to proceed.   The patient presents with right lower back pain that began approximately three nights ago. She describes the pain as spasmodic in nature, significantly worsening at night when attempting to lie down. She denies any history of injury, fall, or trauma preceding symptom onset. She has attempted symptom management with both Tylenol and ibuprofen  with minimal relief, though notes she was able to sleep briefly after taking Tylenol. She works as a tree surgeon at [employer], which requires frequent movement and stair use throughout the day.  The patient reports tracking her ovulation and states she is currently in her fertile window, with her last menstrual period beginning on November 26. She is not using birth control and has a history of miscarriage earlier this year. She notes her urine has appeared darker yellow and mildly cloudy but denies dysuria, urinary frequency or urgency, hematuria, flank pain, bowel changes, or blood in the stool. She also denies numbness, tingling, or weakness in the lower extremities.  The following sections of the patient's history were reviewed and updated as appropriate: allergies, current medications, past family history, past medical history, past social history, past surgical history, and problem list.           History reviewed. No pertinent past medical history.  There are no active problems to display for this patient.   History reviewed. No pertinent surgical  history.  OB History   No obstetric history on file.      Home Medications    Prior to Admission medications   Medication Sig Start Date End Date Taking? Authorizing Provider  cyclobenzaprine  (FLEXERIL ) 10 MG tablet Take 1 tablet (10 mg total) by mouth at bedtime. 10/13/24  Yes Iola Lukes, FNP  lidocaine  (LIDODERM ) 5 % Place 1 patch onto the skin daily. Remove & Discard patch within 12 hours or as directed by MD 10/13/24  Yes Iola Lukes, FNP  methocarbamol  (ROBAXIN ) 500 MG tablet Take 1 tablet (500 mg total) by mouth every morning. 10/13/24  Yes Iola Lukes, FNP  naproxen  (NAPROSYN ) 500 MG tablet Take 1 tablet (500 mg total) by mouth 2 (two) times daily with a meal. Take with food to avoid stomach upset. Do not take any additional NSAIDs while on this. You may take tylenol in addition to this if needed for extra pain relief. 10/13/24  Yes Iola Lukes, FNP  fluticasone  (FLONASE ) 50 MCG/ACT nasal spray Place 2 sprays into both nostrils daily. 08/17/22   Moishe Chiquita HERO, NP  folic acid (FOLVITE) 1 MG tablet Take 1 mg by mouth daily.    [provider]  NON FORMULARY Allergy medicine, otc    [provider]  medroxyPROGESTERone (DEPO-PROVERA) 150 MG/ML injection INJECT 1ML EVERY 11 12 WEEKS 05/01/19 02/12/21  [provider]  phentermine (ADIPEX-P) 37.5 MG tablet Take 37.5 mg by mouth daily. 05/19/19 02/12/21  [provider]    Family History Family History  Problem Relation Age of  Onset   Diabetes Mother    Hypertension Mother    Heart disease Father     Social History Social History   Tobacco Use   Smoking status: Never   Smokeless tobacco: Never  Vaping Use   Vaping status: Never Used  Substance Use Topics   Alcohol use: Yes   Drug use: Never     Allergies   Diphenhydramine hcl (sleep) and Nickel   Review of Systems Review of Systems  Gastrointestinal:  Negative for blood in stool.       No bowel incontinence    Genitourinary:  Negative for dysuria, frequency, hematuria and menstrual problem (LMP 10/03/24).       No bladder incontinence   Musculoskeletal:  Positive for back pain (right lower). Negative for gait problem.  Neurological:  Negative for weakness and numbness.  All other systems reviewed and are negative.    Physical Exam Triage Vital Signs ED Triage Vitals  Encounter Vitals Group     BP 10/13/24 0906 126/86     Girls Systolic BP Percentile --      Girls Diastolic BP Percentile --      Boys Systolic BP Percentile --      Boys Diastolic BP Percentile --      Pulse Rate 10/13/24 0906 83     Resp 10/13/24 0906 17     Temp 10/13/24 0906 98.2 F (36.8 C)     Temp Source 10/13/24 0906 Oral     SpO2 10/13/24 0906 98 %     Weight --      Height --      Head Circumference --      Peak Flow --      Pain Score 10/13/24 0909 8     Pain Loc --      Pain Education --      Exclude from Growth Chart --    No data found.  Updated Vital Signs BP 126/86 (BP Location: Right Arm)   Pulse 83   Temp 98.2 F (36.8 C) (Oral)   Resp 17   LMP 10/03/2024 (Approximate)   SpO2 98%   Visual Acuity Right Eye Distance:   Left Eye Distance:   Bilateral Distance:    Right Eye Near:   Left Eye Near:    Bilateral Near:     Physical Exam Vitals reviewed.  Constitutional:      General: She is not in acute distress.    Appearance: Normal appearance. She is not ill-appearing, toxic-appearing or diaphoretic.  HENT:     Head: Normocephalic.     Mouth/Throat:     Mouth: Mucous membranes are moist.  Cardiovascular:     Rate and Rhythm: Normal rate and regular rhythm.  Pulmonary:     Effort: Pulmonary effort is normal.     Breath sounds: Normal breath sounds.  Abdominal:     Palpations: Abdomen is soft.     Tenderness: There is no right CVA tenderness or left CVA tenderness.  Musculoskeletal:        General: Normal range of motion.     Cervical back: Normal, normal range of motion and  neck supple.     Thoracic back: Normal.     Lumbar back: Tenderness present. No swelling, deformity, lacerations or spasms. Normal range of motion. Negative right straight leg raise test and negative left straight leg raise test.       Back:  Skin:    General: Skin is warm and dry.  Neurological:     General: No focal deficit present.     Mental Status: She is alert and oriented to person, place, and time.     Cranial Nerves: Cranial nerves 2-12 are intact.     Sensory: Sensation is intact.     Motor: Motor function is intact. No weakness.     Coordination: Coordination is intact.     Gait: Gait is intact.  Psychiatric:        Mood and Affect: Mood normal.        Speech: Speech normal.        Behavior: Behavior normal. Behavior is cooperative.      UC Treatments / Results  Labs (all labs ordered are listed, but only abnormal results are displayed) Labs Reviewed  POCT URINE DIPSTICK - Abnormal; Notable for the following components:      Result Value   Clarity, UA cloudy (*)    Spec Grav, UA >=1.030 (*)    Blood, UA small (*)    POC PROTEIN,UA =30 (*)    All other components within normal limits  POCT URINE PREGNANCY    EKG   Radiology No results found.  Procedures Procedures (including critical care time)  Medications Ordered in UC Medications  dexamethasone  (DECADRON ) injection 10 mg (10 mg Intramuscular Given 10/13/24 0950)    Initial Impression / Assessment and Plan / UC Course  I have reviewed the triage vital signs and the nursing notes.  Pertinent labs & imaging results that were available during my care of the patient were reviewed by me and considered in my medical decision making (see chart for details).     Patient presents with low back pain. No numbness, tingling, or weakness in the buttocks or legs. No recent injury, heavy lifting, or pulling reported. No urinary symptoms or blood in stool. Examination and history are consistent with a back muscle  strain. Dexamethasone  were administered intramuscularly for acute pain and inflammation relief. Naproxen  prescribed twice daily, with Robaxin  for morning use and Flexeril  for nighttime use. Lidocaine  patch to affected area daily.l Extra-strength acetaminophen may be taken for additional pain relief, but no other NSAIDs should be used while on naproxen . Advised application of moist heat, avoidance of heavy lifting or strenuous activity, and follow-up with orthopedics in 10 days if symptoms persist. Patient instructed to seek immediate care for new numbness, weakness, bowel or bladder incontinence, severe worsening pain, or fever.  Today's evaluation has revealed no signs of a dangerous process. Discussed diagnosis with patient and/or guardian. Patient and/or guardian aware of their diagnosis, possible red flag symptoms to watch out for and need for close follow up. Patient and/or guardian understands verbal and written discharge instructions. Patient and/or guardian comfortable with plan and disposition.  Patient and/or guardian has a clear mental status at this time, good insight into illness (after discussion and teaching) and has clear judgment to make decisions regarding their care  Documentation was completed with the aid of voice recognition software. Transcription may contain typographical errors.   Final Clinical Impressions(s) / UC Diagnoses   Final diagnoses:  Acute right-sided low back pain without sciatica     Discharge Instructions      Your back pain today appears to be caused by a muscle strain, which is one of the most common reasons for lower back discomfort. There are no signs of nerve damage, and your exam did not show any red flags such as leg weakness, numbness, or issues with bladder or bowel  control. This type of strain can be very painful but usually improves with proper rest, medication, and gentle care over the next several days.  Please take the naproxen  twice a day as  prescribed to reduce inflammation and help with pain. Do not take any other NSAIDs such as ibuprofen , Motrin , or Advil  while using naproxen . If needed, you may take Tylenol (acetaminophen) 1000 mg every six hours for additional pain relief. This equals two 500 mg tablets at a time. Be careful not to take more than 4000 mg of Tylenol in a 24-hour period. You were also prescribed Robaxin  for use in the morning and Flexeril  at bedtime to help relax your muscles. These medications can cause drowsiness, so avoid driving, alcohol, or activities requiring alertness after taking them. Apply the lidocaine  patch to the painful area daily.   Applying moist heat--such as a warm shower, heating pad on low, or warm compress--can help loosen muscle tension. Do not use heat over the lidocaine  patch. Try to avoid heavy lifting, bending, twisting, or any strenuous activity for several days. Gentle walking and stretching may help once your pain starts to improve.  If your symptoms are not improving after 10 days, please follow up with orthopedics for further evaluation. Go to the emergency department right away if you develop new numbness or tingling, weakness in your legs, inability to control your bladder or bowels, fever, or if your pain becomes severe and unmanageable. If you have any concerns or your symptoms change, you should also contact your primary care provider for guidance.      ED Prescriptions     Medication Sig Dispense Auth. Provider   methocarbamol  (ROBAXIN ) 500 MG tablet Take 1 tablet (500 mg total) by mouth every morning. 10 tablet Iola Lukes, FNP   cyclobenzaprine  (FLEXERIL ) 10 MG tablet Take 1 tablet (10 mg total) by mouth at bedtime. 10 tablet Iola Lukes, FNP   naproxen  (NAPROSYN ) 500 MG tablet Take 1 tablet (500 mg total) by mouth 2 (two) times daily with a meal. Take with food to avoid stomach upset. Do not take any additional NSAIDs while on this. You may take tylenol in addition  to this if needed for extra pain relief. 20 tablet Sidnee Gambrill, Raoul, FNP   lidocaine  (LIDODERM ) 5 % Place 1 patch onto the skin daily. Remove & Discard patch within 12 hours or as directed by MD 14 patch Iola Lukes, FNP      PDMP not reviewed this encounter.   Iola Lukes, [place] 10/13/24 1019

## 2024-10-13 NOTE — ED Triage Notes (Signed)
 Pt c/o throbbing right lower back/side pain since Wednesday. Pain is worse a night when laying down.  Denies any urinary symptoms.

## 2024-10-13 NOTE — Discharge Instructions (Addendum)
 Your back pain today appears to be caused by a muscle strain, which is one of the most common reasons for lower back discomfort. There are no signs of nerve damage, and your exam did not show any red flags such as leg weakness, numbness, or issues with bladder or bowel control. This type of strain can be very painful but usually improves with proper rest, medication, and gentle care over the next several days.  Please take the naproxen  twice a day as prescribed to reduce inflammation and help with pain. Do not take any other NSAIDs such as ibuprofen , Motrin , or Advil  while using naproxen . If needed, you may take Tylenol (acetaminophen) 1000 mg every six hours for additional pain relief. This equals two 500 mg tablets at a time. Be careful not to take more than 4000 mg of Tylenol in a 24-hour period. You were also prescribed Robaxin  for use in the morning and Flexeril  at bedtime to help relax your muscles. These medications can cause drowsiness, so avoid driving, alcohol, or activities requiring alertness after taking them. Apply the lidocaine  patch to the painful area daily.   Applying moist heat--such as a warm shower, heating pad on low, or warm compress--can help loosen muscle tension. Do not use heat over the lidocaine  patch. Try to avoid heavy lifting, bending, twisting, or any strenuous activity for several days. Gentle walking and stretching may help once your pain starts to improve.  If your symptoms are not improving after 10 days, please follow up with orthopedics for further evaluation. Go to the emergency department right away if you develop new numbness or tingling, weakness in your legs, inability to control your bladder or bowels, fever, or if your pain becomes severe and unmanageable. If you have any concerns or your symptoms change, you should also contact your primary care provider for guidance.

## 2024-11-30 ENCOUNTER — Telehealth: Admitting: Physician Assistant

## 2024-11-30 DIAGNOSIS — N760 Acute vaginitis: Secondary | ICD-10-CM

## 2024-11-30 DIAGNOSIS — B9689 Other specified bacterial agents as the cause of diseases classified elsewhere: Secondary | ICD-10-CM

## 2024-11-30 DIAGNOSIS — B3731 Acute candidiasis of vulva and vagina: Secondary | ICD-10-CM

## 2024-11-30 MED ORDER — FLUCONAZOLE 150 MG PO TABS: 150.0000 mg | ORAL_TABLET | Freq: Every day | ORAL | 0 refills | Status: AC

## 2024-11-30 MED ORDER — METRONIDAZOLE 500 MG PO TABS: 500.0000 mg | ORAL_TABLET | Freq: Two times a day (BID) | ORAL | 0 refills | Status: AC

## 2024-11-30 NOTE — Progress Notes (Signed)
 Message sent to patient requesting further input regarding current symptoms. Awaiting patient response.

## 2024-11-30 NOTE — Progress Notes (Signed)
 We are sorry that you are not feeling well. Here is how we plan to help! Based on what you shared with me it looks like you: May have a vaginosis due to bacteria  Vaginosis is an inflammation of the vagina that can result in discharge, itching and pain. The cause is usually a change in the normal balance of vaginal bacteria or an infection. Vaginosis can also result from reduced estrogen levels after menopause.  The most common causes of vaginosis are:   Bacterial vaginosis which results from an overgrowth of one on several organisms that are normally present in your vagina.   Yeast infections which are caused by a naturally occurring fungus called candida.   Vaginal atrophy (atrophic vaginosis) which results from the thinning of the vagina from reduced estrogen levels after menopause.   Trichomoniasis which is caused by a parasite and is commonly transmitted by sexual intercourse.  Factors that increase your risk of developing vaginosis include: Medications, such as antibiotics and steroids Uncontrolled diabetes Use of hygiene products such as bubble bath, vaginal spray or vaginal deodorant Douching Wearing damp or tight-fitting clothing Using an intrauterine device (IUD) for birth control Hormonal changes, such as those associated with pregnancy, birth control pills or menopause Sexual activity Having a sexually transmitted infection  Your treatment plan is Metronidazole  or Flagyl  500mg  twice a day for 7 days.  I have electronically sent this prescription into the pharmacy that you have chosen. I have also sent diflucan .   Be sure to take all of the medication as directed. Stop taking any medication if you develop a rash, tongue swelling or shortness of breath. Mothers who are breast feeding should consider pumping and discarding their breast milk while on these antibiotics. However, there is no consensus that infant exposure at these doses would be harmful.  Remember that medication  creams can weaken latex condoms.   HOME CARE:  Good hygiene may prevent some types of vaginosis from recurring and may relieve some symptoms:  Avoid baths, hot tubs and whirlpool spas. Rinse soap from your outer genital area after a shower, and dry the area well to prevent irritation. Don't use scented or harsh soaps, such as those with deodorant or antibacterial action. Avoid irritants. These include scented tampons and pads. Wipe from front to back after using the toilet. Doing so avoids spreading fecal bacteria to your vagina.  Other things that may help prevent vaginosis include:  Don't douche. Your vagina doesn't require cleansing other than normal bathing. Repetitive douching disrupts the normal organisms that reside in the vagina and can actually increase your risk of vaginal infection. Douching won't clear up a vaginal infection. Use a latex condom. Both female and female latex condoms may help you avoid infections spread by sexual contact. Wear cotton underwear. Also wear pantyhose with a cotton crotch. If you feel comfortable without it, skip wearing underwear to bed. Yeast thrives in [place] Your symptoms should improve in the next day or two.  GET HELP RIGHT AWAY IF:  You have pain in your lower abdomen ( pelvic area or over your ovaries) You develop nausea or vomiting You develop a fever Your discharge changes or worsens You have persistent pain with intercourse You develop shortness of breath, a rapid pulse, or you faint.  These symptoms could be signs of problems or infections that need to be evaluated by a medical provider now.  MAKE SURE YOU   Understand these instructions. Will watch your condition. Will get help right  away if you are not doing well or get worse.  Your e-visit answers were reviewed by a board certified advanced clinical practitioner to complete your personal care plan. Depending upon the condition, your plan could have included both over  the counter or prescription medications. Please review your pharmacy choice to make sure that you have choses a pharmacy that is open for you to pick up any needed prescription, Your safety is important to us . If you have drug allergies check your prescription carefully.   You can use MyChart to ask questions about today's visit, request a non-urgent call back, or ask for a work or school excuse for 24 hours related to this e-Visit. If it has been greater than 24 hours you will need to follow up with your provider, or enter a new e-Visit to address those concerns. You will get a MyChart message within the next two days asking about your experience. I hope that your e-visit has been valuable and will speed your recovery.  I have spent 5 minutes in review of e-visit questionnaire, review and updating patient chart, medical decision making and response to patient.   Saraiya Kozma, FNP
